# Patient Record
Sex: Male | Born: 1950 | Race: Black or African American | Hispanic: No | Marital: Married | Smoking: Never smoker
Health system: Southern US, Community
[De-identification: ages and names within clinical notes are randomized; demographics above are authoritative.]

## PROBLEM LIST (undated history)

## (undated) DIAGNOSIS — E079 Disorder of thyroid, unspecified: Secondary | ICD-10-CM

## (undated) DIAGNOSIS — E119 Type 2 diabetes mellitus without complications: Secondary | ICD-10-CM

## (undated) DIAGNOSIS — I10 Essential (primary) hypertension: Secondary | ICD-10-CM

## (undated) HISTORY — PX: REPLACEMENT TOTAL KNEE: SUR1224

---

## 2005-04-20 ENCOUNTER — Inpatient Hospital Stay: Payer: Self-pay | Admitting: Endocrinology

## 2005-04-20 ENCOUNTER — Other Ambulatory Visit: Payer: Self-pay

## 2006-06-30 ENCOUNTER — Ambulatory Visit: Payer: Self-pay | Admitting: Specialist

## 2011-07-28 ENCOUNTER — Ambulatory Visit: Payer: Self-pay | Admitting: Specialist

## 2011-07-28 LAB — BASIC METABOLIC PANEL
BUN: 17 mg/dL (ref 7–18)
Co2: 28 mmol/L (ref 21–32)
Creatinine: 1.27 mg/dL (ref 0.60–1.30)
EGFR (African American): >60
Osmolality: 275 (ref 275–301)
Potassium: 3.8 mmol/L (ref 3.5–5.1)
Sodium: 137 mmol/L (ref 136–145)

## 2011-07-28 LAB — URINALYSIS, COMPLETE
Bacteria: NONE SEEN
Bilirubin,UR: NEGATIVE
Blood: NEGATIVE
Leukocyte Esterase: NEGATIVE
Nitrite: NEGATIVE
Ph: 7 (ref 4.5–8.0)
RBC,UR: 4 /HPF (ref 0–5)
Squamous Epithelial: NONE SEEN
WBC UR: <1 /HPF (ref 0–5)

## 2011-07-28 LAB — PROTIME-INR: Prothrombin Time: 12.9 secs (ref 11.5–14.7)

## 2011-07-28 LAB — CBC
MCH: 30.5 pg (ref 26.0–34.0)
MCHC: 33.4 g/dL (ref 32.0–36.0)
MCV: 91 fL (ref 80–100)
Platelet: 274 10*3/uL (ref 150–440)
RDW: 13.7 % (ref 11.5–14.5)

## 2011-08-12 ENCOUNTER — Inpatient Hospital Stay: Payer: Self-pay | Admitting: Specialist

## 2011-08-13 LAB — BASIC METABOLIC PANEL
Anion Gap: 7 (ref 7–16)
Chloride: 104 mmol/L (ref 98–107)
Co2: 28 mmol/L (ref 21–32)
Creatinine: 1.22 mg/dL (ref 0.60–1.30)
Glucose: 104 mg/dL — ABNORMAL HIGH (ref 65–99)
Osmolality: 279 (ref 275–301)
Potassium: 3.6 mmol/L (ref 3.5–5.1)
Sodium: 139 mmol/L (ref 136–145)

## 2011-08-13 LAB — CBC WITH DIFFERENTIAL/PLATELET
Basophil #: 0 10*3/uL (ref 0.0–0.1)
Eosinophil #: 0.1 10*3/uL (ref 0.0–0.7)
Eosinophil %: 1.1 %
HGB: 10.7 g/dL — ABNORMAL LOW (ref 13.0–18.0)
Lymphocyte #: 1.5 10*3/uL (ref 1.0–3.6)
MCH: 29.8 pg (ref 26.0–34.0)
MCHC: 32.3 g/dL (ref 32.0–36.0)
MCV: 92 fL (ref 80–100)
Monocyte #: 1.5 x10 3/mm — ABNORMAL HIGH (ref 0.2–1.0)
Neutrophil #: 8.9 10*3/uL — ABNORMAL HIGH (ref 1.4–6.5)
Neutrophil %: 74.3 %
RBC: 3.61 10*6/uL — ABNORMAL LOW (ref 4.40–5.90)
RDW: 13.5 % (ref 11.5–14.5)

## 2011-08-14 LAB — HEMOGLOBIN: HGB: 10.2 g/dL — ABNORMAL LOW (ref 13.0–18.0)

## 2013-01-03 ENCOUNTER — Ambulatory Visit: Payer: Self-pay | Admitting: Specialist

## 2013-01-03 LAB — URINALYSIS, COMPLETE
Bilirubin,UR: NEGATIVE
Glucose,UR: NEGATIVE mg/dL (ref 0–75)
Nitrite: NEGATIVE

## 2013-01-03 LAB — APTT: Activated PTT: 32.7 secs (ref 23.6–35.9)

## 2013-01-03 LAB — BASIC METABOLIC PANEL
Co2: 28 mmol/L (ref 21–32)
Creatinine: 1.6 mg/dL — ABNORMAL HIGH (ref 0.60–1.30)
EGFR (Non-African Amer.): 46 — ABNORMAL LOW
Glucose: 110 mg/dL — ABNORMAL HIGH (ref 65–99)

## 2013-01-03 LAB — PROTIME-INR
INR: 0.9
Prothrombin Time: 12.7 secs (ref 11.5–14.7)

## 2013-01-03 LAB — MRSA PCR SCREENING

## 2013-01-03 LAB — HEMOGLOBIN: HGB: 13.1 g/dL (ref 13.0–18.0)

## 2013-01-10 ENCOUNTER — Inpatient Hospital Stay: Payer: Self-pay | Admitting: Specialist

## 2013-01-11 LAB — BASIC METABOLIC PANEL
Anion Gap: 5 — ABNORMAL LOW (ref 7–16)
BUN: 15 mg/dL (ref 7–18)
Calcium, Total: 8.5 mg/dL (ref 8.5–10.1)
EGFR (African American): >60
EGFR (Non-African Amer.): >60
Glucose: 126 mg/dL — ABNORMAL HIGH (ref 65–99)
Osmolality: 278 (ref 275–301)
Sodium: 138 mmol/L (ref 136–145)

## 2013-01-11 LAB — CBC WITH DIFFERENTIAL/PLATELET
Basophil %: 0.4 %
Eosinophil #: 0.2 10*3/uL (ref 0.0–0.7)
Eosinophil %: 1.4 %
Lymphocyte #: 1.5 10*3/uL (ref 1.0–3.6)
Lymphocyte %: 13.6 %
MCHC: 34.2 g/dL (ref 32.0–36.0)
Monocyte #: 1.4 x10 3/mm — ABNORMAL HIGH (ref 0.2–1.0)
Neutrophil #: 7.8 10*3/uL — ABNORMAL HIGH (ref 1.4–6.5)
Neutrophil %: 72 %
Platelet: 226 10*3/uL (ref 150–440)
RBC: 3.77 10*6/uL — ABNORMAL LOW (ref 4.40–5.90)

## 2013-01-12 LAB — HEMOGLOBIN: HGB: 11.2 g/dL — ABNORMAL LOW (ref 13.0–18.0)

## 2013-01-14 IMAGING — CR DG KNEE 1-2V*L*
1 series · 2 of 2 positions shown · non-contrast
Comparison: none

REASON FOR EXAM: post op
COMMENTS:   Bedside (portable):Y

[Series 1: ap · 0.17mm/px · 2 of 2 slices shown]
[im 1/2]
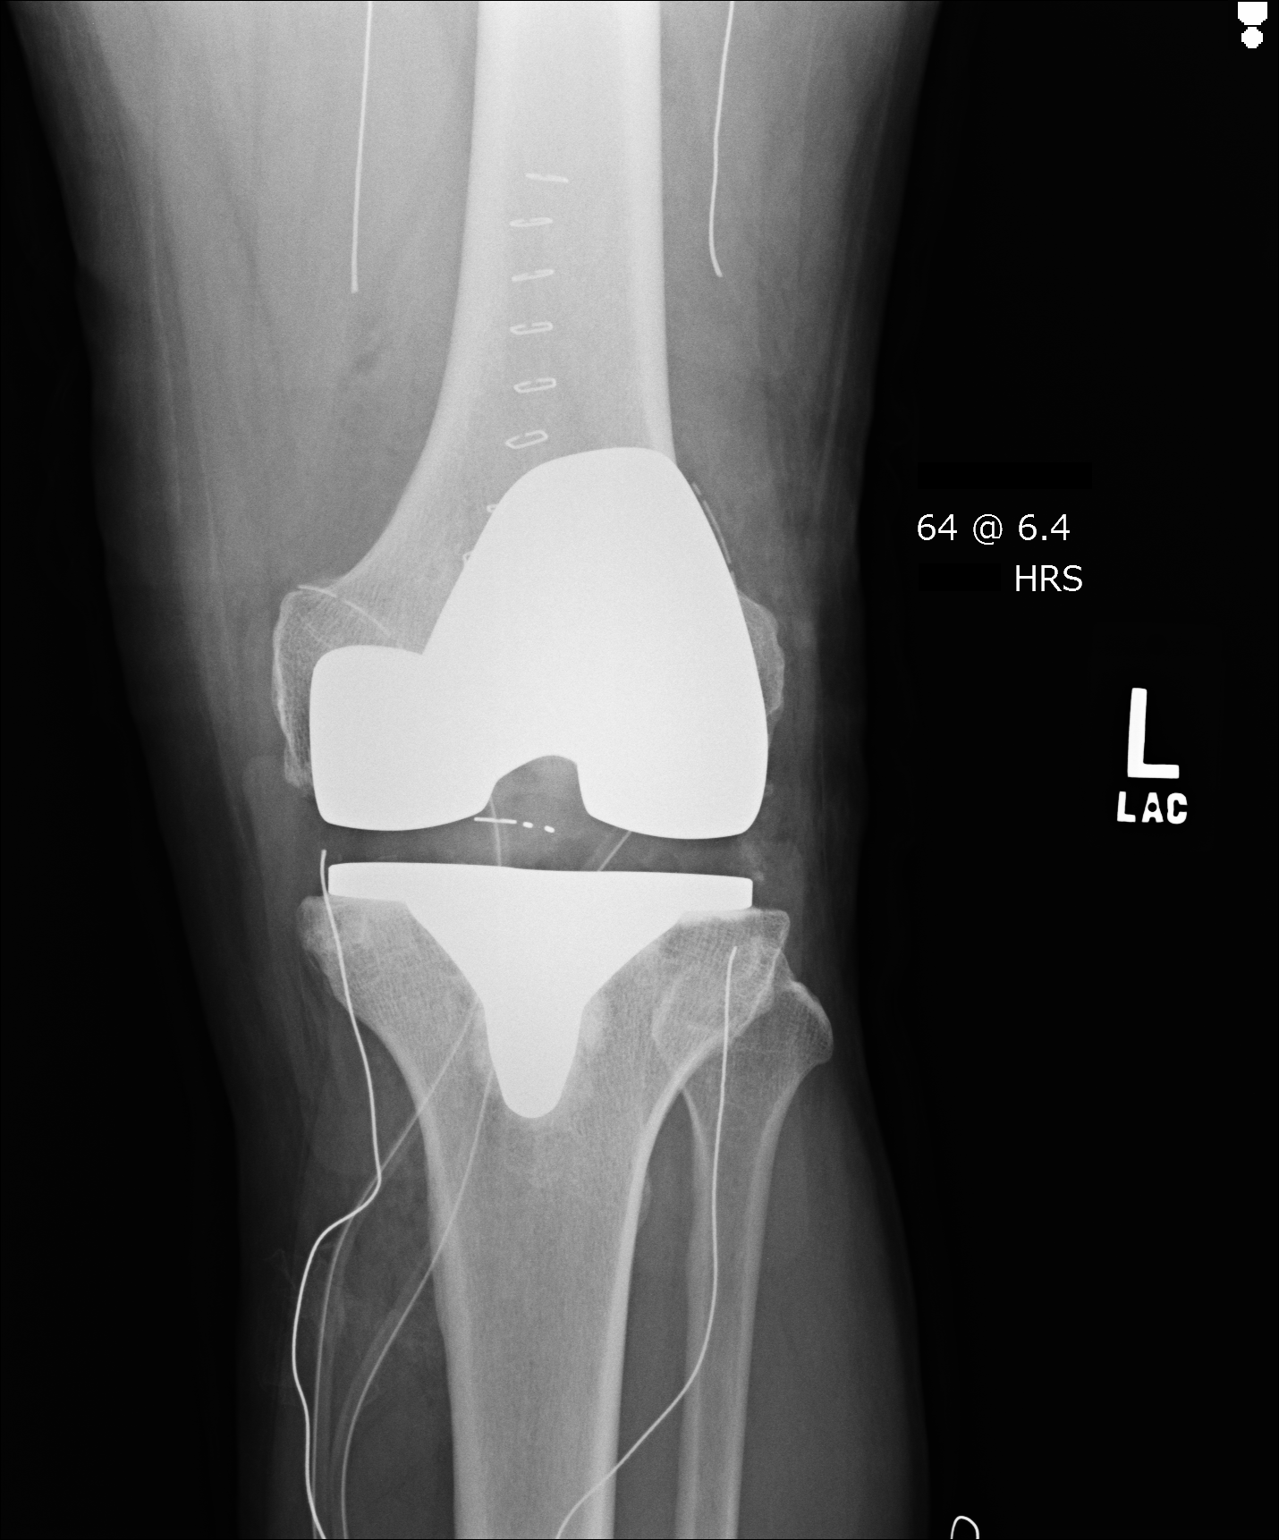
[im 2/2]
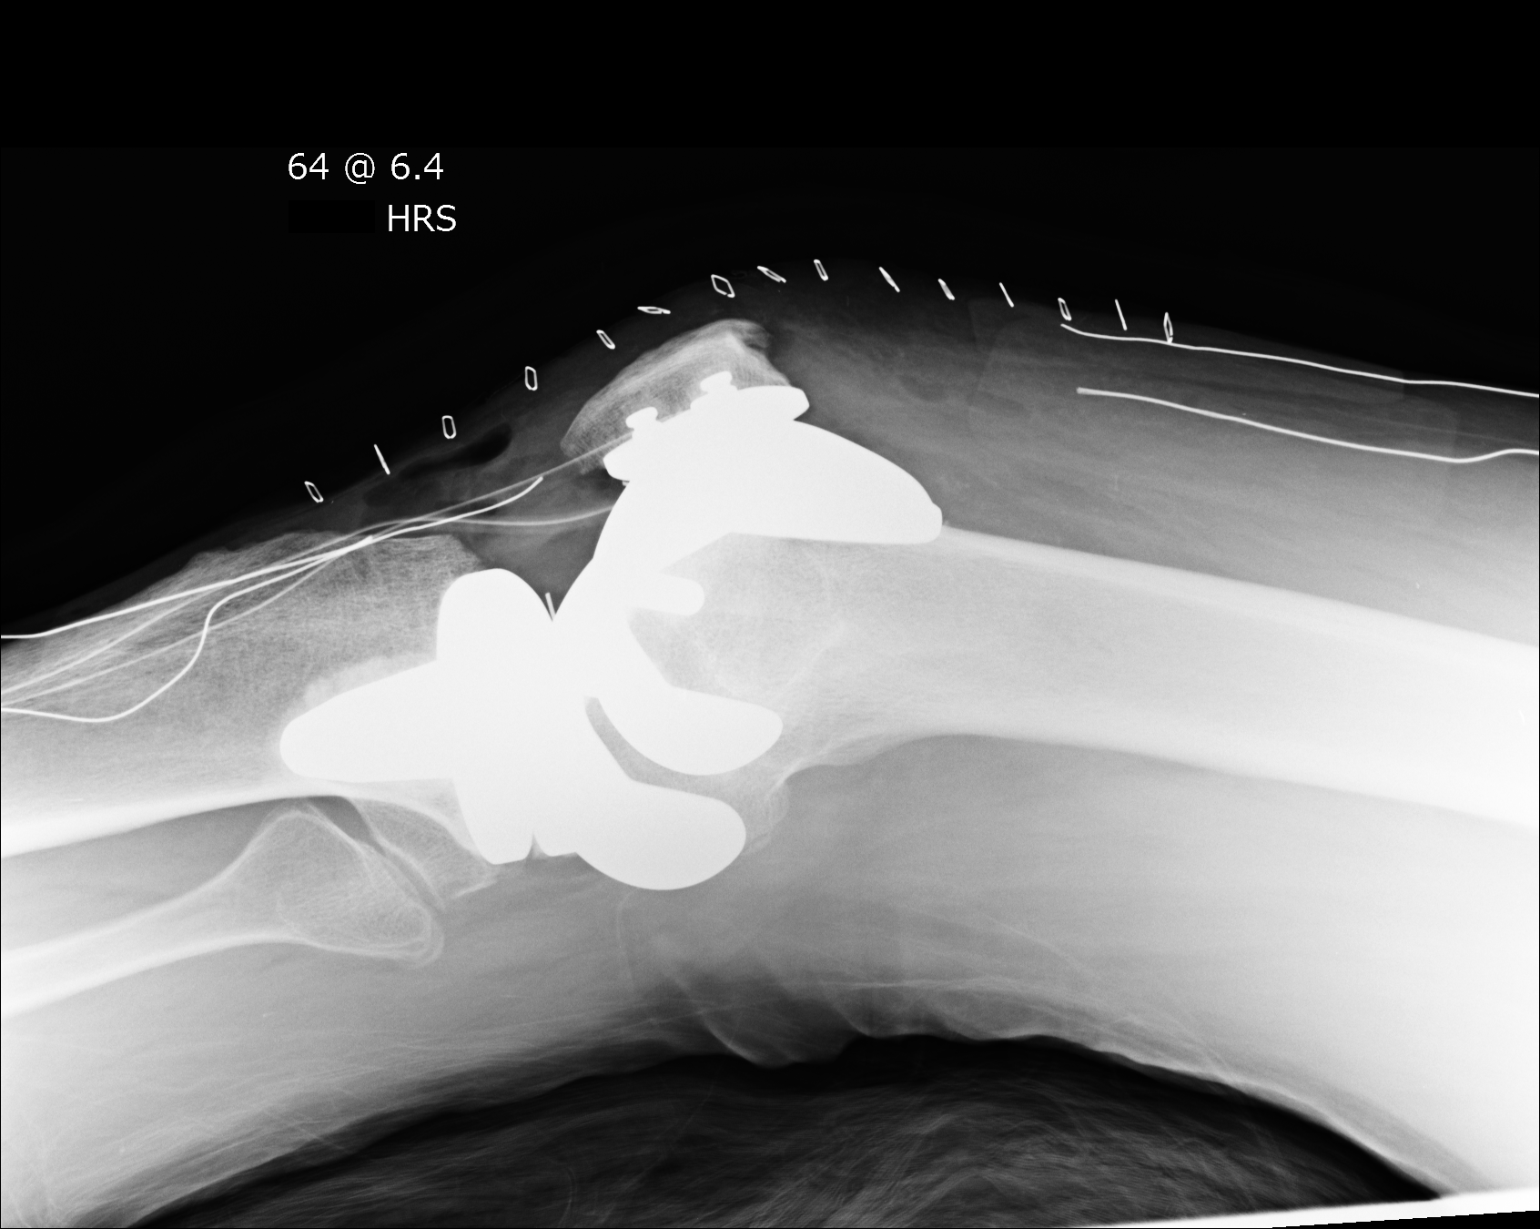

[2 of 2 positions shown; findings below may reference images not displayed]

PROCEDURE:     DXR - DXR KNEE LEFT AP AND LATERAL  - August 12, 2011  [DATE]

RESULT:     The patient is status post left knee arthroplasty. Surgical
drains and skin staples are present. A true lateral crosstable lateral view
was not obtained. There is no definite postoperative acute bony or harbor
complication.
IMPRESSION: Please see above.

[REDACTED]

## 2013-05-25 ENCOUNTER — Ambulatory Visit: Payer: Self-pay | Admitting: Gastroenterology

## 2013-05-28 LAB — PATHOLOGY REPORT

## 2014-06-22 NOTE — Discharge Summary (Signed)
PATIENT NAME:  Frederick Lopez, Frederick Lopez MR#:  [id number] DATE OF BIRTH:  August 13, 1950  DATE OF ADMISSION:  01/10/2013 DATE OF DISCHARGE:  01/13/2013  FINAL DIAGNOSES:   1.  Advanced osteoarthritis, right knee.  2.  Hypothyroidism.  3.  Hypertension.  4.  Diabetes mellitus.   OPERATIONS: 01/10/2013: Cemented DePuy rotating platform LCS total knee replacement.   COMPLICATIONS: None.   CONSULTATIONS: None.   DISCHARGE MEDICATIONS: Home medications as prior to admission, Norco 7.5/325 p.r.n. q.6 h. p.r.n. pain, Mobic 15 mg daily, Neurontin 400 mg b.i.Lopez., enteric-coated aspirin 1 p.o. b.i.Lopez.    HISTORY OF PRESENT ILLNESS: The patient is a 64 year old male with advanced osteoarthritis of the right knee causing significant pain and disability with normal activities and sleep. He has had treatment for several years including NSAIDs, injections, braces, and exercise. He had a successful left total knee replacement last year and wishes to proceed on the right. The risks and benefits were discussed with them. X-rays showed advanced osteoarthritis of the right knee with joint collapse, subluxation, sclerosis, and cysts.   PAST MEDICAL HISTORY:   ILLNESSES: As above.   ALLERGIES: None.   HOME MEDICATIONS: Byetta 0.04 mL b.i.Lopez., Janumet 50 mg b.i.Lopez., Synthroid 100 mcg daily, meloxicam 15 mg daily, lisinopril 20 mg daily, Viagra as needed, aspirin 81 mg daily.  PAST SURGICAL HISTORY: Appendectomy and left total knee replacement.   FAMILY HISTORY: Unremarkable.   SOCIAL HISTORY: The patient does not smoke or drink. He lives at home with his wife.  REVIEW OF SYSTEMS: Unremarkable.   PHYSICAL EXAMINATION: Normal except for the knees. The left knee showed a well-healed incision with motion from 0 to 110 degrees. The right knee showed increased varus with medial joint line tenderness. Motion was 10 to 95 degrees. Neurovascular status was good distally, and the skin was intact.   LABORATORY DATA: On admission  was satisfactory.   HOSPITAL COURSE: On 01/10/2013, the patient underwent right cemented LCS rotating platform total knee replacement. Postoperatively, he did well with no significant problems. Hemoglobin was 11.2 on the second postoperative day. He was discharged home on 01/13/2013. He will get home physical therapy and be partial weight-bearing on a walker. He will return to clinic in 2 weeks for exam.   ____________________________ Valinda HoarHoward E. Waniya Hoglund, MD hem:jcm Lopez: 02/01/2013 16:43:00 ET T: 02/01/2013 22:03:52 ET JOB#: [id number]  cc: Valinda HoarHoward E. Shelvia Fojtik, MD, <Dictator> Valinda HoarHOWARD E Vernona Peake MD ELECTRONICALLY SIGNED 02/02/2013 7:42

## 2014-06-22 NOTE — H&P (Signed)
   Subjective/Chief Complaint Right knee pain   History of Present Illness 64 year old male has advanced osteoarthritis right knee that causes significant pain and disability with normal daily activities and sleep.  Has had treatment for several years including NSAIDs, injections, braces, and exercise.  Had a successfur left total knee replacement last year and wishes to proceed with right knee now.  Risks and benefits of surgery were discussed at length including but not limited to infection, non union, nerve or blood vessed damage, non union, need for repeat surgery, blood clots and lung emboli, and death. X-rays show advanced osteoarthritis right knee with joint collapse, subluxation, sclerosis, spurs, and cysts.   Past Med/Surgical Hx:  Arthritis:   Hypothyroidism:   Hypertension:   Diabetes Mellitus, Type II (NIDD):   Appendectomy:   ALLERGIES:  No Known Allergies:   HOME MEDICATIONS: Medication Instructions Status  Byetta Prefilled Pen 10 mcg/0.04 mL subcutaneous solution milliliter(s) subcutaneous 2 times a day Active  Janumet 50 mg-1000 mg oral tablet 1 tab(s) orally 2 times a day Active  Synthroid 100 mcg (0.1 mg) oral tablet 1 tab(s) orally once a day Active  meloxicam 15 mg oral tablet 1 tab(s) orally once a day Active  lisinopril 20 mg oral tablet 1 tab(s) orally once a day (in the morning) Active  Viagra 100 mg oral tablet 1 tab(s) orally once a day, As Needed Active  aspirin 81 mg oral tablet 1 tab(s) orally once a day Active   Family and Social History:  Family History Non-Contributory   Social History negative tobacco   Place of Living Home   Review of Systems:  Fever/Chills No   Cough No   Sputum No   Abdominal Pain No   Physical Exam:  GEN well developed, well nourished, no acute distress   HEENT pink conjunctivae   NECK supple   RESP normal resp effort   CARD regular rate   ABD denies tenderness   LYMPH negative neck   EXTR negative edema,  Right knee has range of motion 10-95*.  Joint lines are tender. circulation/sensation/motor function good distally.  skin intact.   SKIN normal to palpation   NEURO motor/sensory function intact   PSYCH alert, A+O to time, place, person    Assessment/Admission Diagnosis Advanced osteoarthritis right knee   Plan Right total knee replacement   Electronic Signatures: Valinda HoarMiller, Salisha Bardsley E (MD)  (Signed [phone]-Nov-14 18:50)  Authored: CHIEF COMPLAINT and HISTORY, PAST MEDICAL/SURGIAL HISTORY, ALLERGIES, HOME MEDICATIONS, FAMILY AND SOCIAL HISTORY, REVIEW OF SYSTEMS, PHYSICAL EXAM, ASSESSMENT AND PLAN   Last Updated: 10-Nov-14 18:50 by Valinda HoarMiller, Elizabth Palka E (MD)

## 2014-06-22 NOTE — Op Note (Signed)
PATIENT NAME:  Frederick Lopez, [name] MR#:  [id number] DATE OF BIRTH:  04-20-1950  DATE OF PROCEDURE:  01/10/2013  PREOPERATIVE DIAGNOSIS: Advanced osteoarthritis, right knee.   POSTOPERATIVE DIAGNOSIS: Advanced osteoarthritis, right knee.   PROCEDURE PERFORMED: DePuy cemented LCS rotating platform total knee replacement (large femur/patella, #5 keeled tibia, 12.5 mm rotating polyethylene insert).   SURGEON: Valinda HoarHoward E. Bhavana Kady, M.D.   ASSISTANT: Juanell FairlyKevin Krasinski, MD   ANESTHESIA: Spinal plus femoral nerve block.   COMPLICATIONS: None.   DRAINS: Two Autovacs.   ESTIMATED BLOOD LOSS: None.  REPLACEMENTS: None.   DESCRIPTION OF PROCEDURE: The patient was brought to the operating room where he underwent satisfactory spinal anesthesia after a right femoral nerve block. He was placed in supine position and padded appropriately on the operating room table. The right leg was prepped and draped in sterile fashion and an Esmarch was applied. The tourniquet was inflated to 350 mmHg. Tourniquet time was 114 minutes. A midline anterior incision was made and dissection carried out sharply through subcutaneous tissue. Medial arthrotomy was carried out and soft tissue debridement was carried out. The knee showed shiny sclerotic bare bone on the medial femoral condyle and lateral femoral condyle. The tibia was damaged and there were large spurs throughout. The tibial alignment jig was inserted and the cutting block pinned in place appropriately. The proximal tibial cut was then made. The ligaments were checked and were well balanced. The femur was sized as a large and the centering hole made. The rotation guide was inserted along with a 2.5 mm shim and the flexion gap was established at 12.5 mm. This was quite stable. Alignment was excellent. The anterior cutting guide was pinned in place and the anterior and posterior cuts were made. The distal 4 degree valgus distal femoral cutting guide was inserted and the distal  femoral cut made. Finishing guide was then applied and the finishing cuts made. The tibia was then sized as a #5 and the centering hole made. The trial was put in place and a 12.5 mm trial poly was inserted. The large femoral component was inserted and the knee articulated nicely. There was excellent stability in flexion and extension with full good extension. The patella was then cut and drilled. A trial was inserted and it tracked well. The trials were then all removed and the knee was thoroughly irrigated and dried with final soft tissue and debridement carried out while cement was mixed. The #5 keeled tibial component was then cemented in place along with a large femur and patella. The 12.5 mm rotating platform polyethylene insert was put in place and the knee articulated. All excess cement was removed and the cement was allowed to harden around all components. The range of motion remained excellent. The wound was again irrigated. Soft tissues were infiltrated with 60 mL of 0.25% Marcaine with epinephrine, Toradol and morphine. Autovac drains were inserted. The capsule was closed with #2 quill and the subcutaneous tissue was closed with 0 quill. The skin was closed with staples. A dry sterile dressing was applied over TENS pads. The Autovac was activated. The tourniquet was deflated with good return of blood flow to the foot. The Polar Care and knee immobilizer were applied. The patient was transferred to her hospital bed and taken to recovery in good condition.  ____________________________ Valinda HoarHoward E. Ryann Pauli, MD hem:sb D: 01/10/2013 10:39:57 ET T: 01/10/2013 10:51:03 ET JOB#: [id number]  cc: Valinda HoarHoward E. Tarrance Januszewski, MD, <Dictator> Valinda HoarHOWARD E Ruben Pyka MD ELECTRONICALLY SIGNED 01/10/2013 17:56

## 2014-06-24 NOTE — Discharge Summary (Signed)
PATIENT NAME:  Frederick Lopez, Frederick Lopez MR#:  [id number] DATE OF BIRTH:  1950/06/04  DATE OF ADMISSION:  08/12/2011 DATE OF DISCHARGE:  08/15/2011  DISCHARGE DIAGNOSES:   1. Advanced osteoarthritis of both knees, left greater than right. 2. Hypothyroidism.  3. Hypertension.   OPERATION: On 08/12/2011, a DePuy cemented rotating platform LCS total knee replacement. Complications were none.   CONSULTATIONS: None.   DISCHARGE MEDICATIONS:  1. Enteric-coated aspirin1 p.o. b.i.Lopez. for 6 weeks.  2. Norco 5/325, 1 to 2 every 6 hours p.r.n. pain. 3. Neurontin 400 mg b.i.Lopez.  4. Mobic 15 mg daily.  5. Byetta injection 5 mcg subcutaneous every day. 6. Synthroid 0.1 mg in the a.m.  7. Zestoretic 20/12.5 mg, 1 daily.  8. Metformin 1000 mg b.i.Lopez.  9. Januvia 50 mg b.i.Lopez.   HISTORY: The patient is a 64 year old male with advanced osteoarthritis of both knees, left greater than right. He has been followed for several years and has had anti-inflammatories, cortisone injections, exercise program, activity modification, and rest. He continues to work and is on his feet all day. He has reached the point that he has constant knee pain and has had trouble doing his work and with sleep. X-rays show complete medial joint line narrowing and loss of joint space and spurring in all three compartments in both knees, cyst formation and sclerosis, some instability on the left. He has difficulty with stairs, yard work, his regular job and kneeling and squatting. He wishes to proceed with left total knee replacement. The risks, benefits and postoperative protocol were discussed with him at length.    PAST MEDICAL HISTORY/ILLNESSES: As above.   ALLERGIES: None.   REVIEW OF SYSTEMS: Unremarkable.   FAMILY HISTORY: Unremarkable.   SOCIAL HISTORY: The patient is widowed. 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 He does not smoke but did in the past for several years.   PHYSICAL EXAMINATION:  VITAL SIGNS: The vital signs are  normal.   GENERAL: The patient was awake and alert and cooperative.   LEFT LEG: The left leg showed varus deformity with medial joint line tenderness. Motion was 10 to 90 degrees. His neurovascular status was good distally. There was no real effusion. There was pain with movement.   LABORATORY DATA: Laboratory data on admission was satisfactory.   HOSPITAL COURSE: On 08/12/2011, the patient underwent cemented rotating platform left total knee replacement. Postoperatively he did well with no significant pain or problems. His hemoglobin was 10.2 on the second postoperative day. He was gradually ambulated and did well with physical therapy. He was stable and ready for discharge on 08/15/2011.   DISCHARGE INSTRUCTIONS:  1. He was discharged with Home Health physical therapy, partial weight-bearing on a walker.  2. He is to be seen in my office in 12 to 14 days for exam and x-ray.  ____________________________ Valinda HoarHoward E. Dortha Neighbors, MD hem:cbb Lopez: 08/25/2011 08:45:52 ET T: 08/25/2011 10:28:21 ET JOB#: [id number]  cc: Valinda HoarHoward E. Keyira Mondesir, MD, <Dictator> Alan MulderShamil J. Morayati, MD Valinda HoarHOWARD E Javyon Fontan MD ELECTRONICALLY SIGNED 08/27/2011 10:31

## 2014-06-24 NOTE — H&P (Signed)
PATIENT NAME:  Frederick Lopez, Frederick Lopez MR#:  [id number] DATE OF BIRTH:  1950-08-20  DATE OF ADMISSION:  08/12/2011  CHIEF COMPLAINT: Pain in left knee.   HISTORY OF PRESENT ILLNESS: The patient is a 64 year old male who has had progressive arthritis of both knees, left greater than right, over the last several years. I have been following him since 2008. He has had multiple injections over the years along with anti-inflammatory medications, exercise programs, and activity modifications. He continues to work on his feet. He has reached the point that x-rays show advanced osteoarthritis of the knees. He has complete medial joint line narrowing and loss of joint space and spurring in all three compartments. There is cyst formation and sclerosis. The patient wishes to proceed with left total knee replacement. The risks and benefits have been discussed with the patient and his wife along with the postoperative protocol and possible complications.   PAST MEDICAL HISTORY:  1. Hypothyroidism.  2. Hypertension.   MEDICATIONS:  Meloxicam, Synthroid, lisinopril, Viagra and InnoPran.   PAST SURGICAL HISTORY: None.   HOSPITALIZATIONS: Hospitalizations for diabetes, high blood pressure and thyroid disease.   REVIEW OF SYSTEMS: The review of systems is unremarkable.   FAMILY HISTORY: Unremarkable.   SOCIAL HISTORY: The patient does not smoke but did in the past for several years. 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   PHYSICAL EXAMINATION:  GENERAL: The patient is alert and cooperative.   HEENT: Pupils are equal, round, and reactive to light. Extraocular motions are full.  NECK: Supple without jugular venous distention or adenopathy.   LUNGS: Clear.   HEART: Regular rate without murmur, rub, or gallop.   ABDOMEN: Soft and nontender.   BACK: Nontender.   GENITALIA: Normal.   RECTAL: Exam is refused.   ORTHOPEDIC EXAM:  Exam shows bilateral varus of the knees. There is medial joint  line tenderness bilaterally. Motion is 5 to 90 degrees bilaterally. Hips with good motion. Neurovascular status is good distally without peripheral edema.   SKIN: Intact.   IMPRESSION: Advanced osteoarthritis of both knees, left greater than right.   RECOMMENDATION: The patient is being admitted for left total knee replacement.   ____________________________ Valinda HoarHoward E. Lisaanne Lawrie, MD hem:cbb Lopez: 08/11/2011 18:37:00 ET T: 08/11/2011 19:00:00 ET JOB#: [id number]  cc: Valinda HoarHoward E. Egan Sahlin, MD, <Dictator> Valinda HoarHOWARD E Arian Mcquitty MD ELECTRONICALLY SIGNED 08/11/2011 19:13

## 2014-06-24 NOTE — Op Note (Signed)
PATIENT NAME:  Frederick Lopez, Umberto D MR#:  [id number] DATE OF BIRTH:  20-Jun-1950  DATE OF PROCEDURE:  08/12/2011  PREOPERATIVE DIAGNOSIS: Severe osteoarthritis of left knee with varus deformity.   POSTOPERATIVE DIAGNOSIS: Severe osteoarthritis of left knee with varus deformity.   PROCEDURE: Left DePuy cemented rotating platform LCS total knee replacement (large femur/patella, #4 tibial tray, 10 mm rotating platform polyethylene spacer).   SURGEON: Valinda HoarHoward E. Hula Tasso, M.D.   ASSISTANT: Juanell FairlyKevin Krasinski, M.D.   ANESTHESIA: Spinal plus femoral nerve block.   COMPLICATIONS: None.   DRAINS: Two Autovacs.   ESTIMATED BLOOD LOSS: None.   REPLACEMENTS: None.   DESCRIPTION OF PROCEDURE: The patient was brought to the Operating Room where he underwent satisfactory spinal anesthesia, after a left femoral nerve block. The left leg was prepped and draped in sterile fashion. An Esmarch was applied. The tourniquet was inflated to 350 mmHg. Tourniquet time was 112 minutes. An anterior midline incision was made and dissection carried out sharply through subcutaneous tissue. Medial arthrotomy was carried out and soft tissue debridement carried out. Tibial alignment jig was applied and the tibial cutting jig pinned in place. The proximal tibial cut was made and was felt to be in very good position. The ligaments had been released medially to balance them. The femur was sized as a large and the centering hole made in the femur. The anterior cutting block was pinned in place with the 10 mm flexion gap established and quite stable. The anterior posterior cuts were made and the distal femoral cutting guide inserted at 4 degrees of valgus. The distal femoral cuts were made and the finishing block applied and the cuts made. The tibia was sized as #4 and a centering hole made. The trial tibial insert and the femoral component were inserted and the knee articulated nicely. He had full extension and flexion and good alignment.  The patella was cut and drilled and trial inserted. This tracked well. The trials were removed and the joint thoroughly irrigated, debrided, and cleaned up while cement was mixed. The #4 rotating platform keeled tibial tray was cemented in place along with the large femur and patella. A 10 mm rotating platform polyethylene insert was then inserted as well. All excess cement was removed and the cement allowed to harden. Thorough irrigation was again carried out and all excess cement was removed. The soft tissues were infiltrated with 60 mL of 0.25% Marcaine with epinephrine, Toradol, and morphine. The Autovacs were inserted. The fascia was closed with #2 quill suture and the subcutaneous tissue was closed with 2-0 quill. The skin was closed with staples. A dry sterile dressing was applied over Xeroform and TENS pads. The Autovac was hooked up and activated. The dressing was completed and Polar Care and knee immobilizer applied. There was good return of blood flow to the foot when the tourniquet was deflated. The patient was transferred to a stretcher and taken to recovery in good condition. ____________________________ Valinda HoarHoward E. Zlatan Hornback, MD hem:slb D: 08/12/2011 16:02:19 ET T: 08/12/2011 17:06:00 ET JOB#: [id number]  cc: Valinda HoarHoward E. Meghna Hagmann, MD, <Dictator> Valinda HoarHOWARD E Teodora Baumgarten MD ELECTRONICALLY SIGNED 08/14/2011 11:40

## 2019-08-18 ENCOUNTER — Other Ambulatory Visit: Payer: Self-pay

## 2019-08-18 ENCOUNTER — Ambulatory Visit
Admission: EM | Admit: 2019-08-18 | Discharge: 2019-08-18 | Disposition: A | Discharge status: Home / Self Care | Payer: Medicare HMO

## 2019-08-18 ENCOUNTER — Ambulatory Visit (INDEPENDENT_AMBULATORY_CARE_PROVIDER_SITE_OTHER): Payer: Medicare HMO

## 2019-08-18 DIAGNOSIS — S46912A Strain of unspecified muscle, fascia and tendon at shoulder and upper arm level, left arm, initial encounter: Secondary | ICD-10-CM

## 2019-08-18 HISTORY — DX: Type 2 diabetes mellitus without complications: E11.9

## 2019-08-18 HISTORY — DX: Essential (primary) hypertension: I10

## 2019-08-18 HISTORY — DX: Disorder of thyroid, unspecified: E07.9
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
## 2019-08-18 NOTE — Discharge Instructions (Addendum)
Return if any problems.  See your Physician for recheck if symptoms persist past one week.  Ibuprofen for discomfort
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
## 2020-03-29 DIAGNOSIS — Z96653 Presence of artificial knee joint, bilateral: Secondary | ICD-10-CM | POA: Insufficient documentation

## 2022-02-02 LAB — COLOGUARD: COLOGUARD: POSITIVE — AB

## 2022-12-04 DIAGNOSIS — Z23 Encounter for immunization: Secondary | ICD-10-CM | POA: Diagnosis not present

## 2022-12-21 DIAGNOSIS — K219 Gastro-esophageal reflux disease without esophagitis: Secondary | ICD-10-CM | POA: Diagnosis not present

## 2022-12-21 DIAGNOSIS — R269 Unspecified abnormalities of gait and mobility: Secondary | ICD-10-CM | POA: Insufficient documentation

## 2022-12-21 DIAGNOSIS — E119 Type 2 diabetes mellitus without complications: Secondary | ICD-10-CM | POA: Insufficient documentation

## 2022-12-21 DIAGNOSIS — M6530 Trigger finger, unspecified finger: Secondary | ICD-10-CM | POA: Insufficient documentation

## 2022-12-21 DIAGNOSIS — Z125 Encounter for screening for malignant neoplasm of prostate: Secondary | ICD-10-CM | POA: Diagnosis not present

## 2022-12-21 DIAGNOSIS — E039 Hypothyroidism, unspecified: Secondary | ICD-10-CM | POA: Diagnosis not present

## 2022-12-21 DIAGNOSIS — M25669 Stiffness of unspecified knee, not elsewhere classified: Secondary | ICD-10-CM | POA: Insufficient documentation

## 2022-12-21 DIAGNOSIS — I10 Essential (primary) hypertension: Secondary | ICD-10-CM | POA: Insufficient documentation

## 2023-04-13 DIAGNOSIS — E119 Type 2 diabetes mellitus without complications: Secondary | ICD-10-CM | POA: Diagnosis not present

## 2023-04-13 DIAGNOSIS — Z0000 Encounter for general adult medical examination without abnormal findings: Secondary | ICD-10-CM | POA: Diagnosis not present

## 2023-04-13 DIAGNOSIS — I10 Essential (primary) hypertension: Secondary | ICD-10-CM | POA: Diagnosis not present

## 2023-04-13 DIAGNOSIS — E039 Hypothyroidism, unspecified: Secondary | ICD-10-CM | POA: Diagnosis not present

## 2023-04-13 DIAGNOSIS — Z125 Encounter for screening for malignant neoplasm of prostate: Secondary | ICD-10-CM | POA: Diagnosis not present

## 2023-04-13 DIAGNOSIS — K219 Gastro-esophageal reflux disease without esophagitis: Secondary | ICD-10-CM | POA: Diagnosis not present

## 2023-04-13 DIAGNOSIS — E669 Obesity, unspecified: Secondary | ICD-10-CM | POA: Diagnosis not present

## 2023-04-13 DIAGNOSIS — Z6838 Body mass index (BMI) 38.0-38.9, adult: Secondary | ICD-10-CM | POA: Diagnosis not present

## 2023-04-21 DIAGNOSIS — M7021 Olecranon bursitis, right elbow: Secondary | ICD-10-CM | POA: Diagnosis not present

## 2023-04-22 ENCOUNTER — Telehealth: Payer: Self-pay

## 2023-04-22 ENCOUNTER — Other Ambulatory Visit: Payer: Self-pay

## 2023-04-22 DIAGNOSIS — R195 Other fecal abnormalities: Secondary | ICD-10-CM

## 2023-04-22 DIAGNOSIS — Z1211 Encounter for screening for malignant neoplasm of colon: Secondary | ICD-10-CM

## 2023-04-22 MED ORDER — PEG 3350-KCL-NA BICARB-NACL 420 G PO SOLR: 4000.0000 mL | Freq: Once | ORAL | 0 refills | Status: AC

## 2023-04-22 NOTE — Telephone Encounter (Signed)
 Gastroenterology Pre-Procedure Review  Request Date: 06/25/23 Requesting Physician: Dr. Tobi Bastos  PATIENT REVIEW QUESTIONS: The patient responded to the following health history questions as indicated:    1. Are you having any GI issues? Positive cologuard. States when he eats spicy foods he gets stomach ulcers 2. Do you have a personal history of Polyps? no 3. Do you have a family history of Colon Cancer or Polyps? no 4. Diabetes Mellitus? Yes takes janumet has been advised to stop 2 days prior to colonoscopy 5. Joint replacements in the past 12 months?no 6. Major health problems in the past 3 months?no 7. Any artificial heart valves, MVP, or defibrillator?no    MEDICATIONS & ALLERGIES:    Patient reports the following regarding taking any anticoagulation/antiplatelet therapy:   Plavix, Coumadin, Eliquis, Xarelto, Lovenox, Pradaxa, Brilinta, or Effient? no Aspirin? no  Patient confirms/reports the following medications:  Current Outpatient Medications  Medication Sig Dispense Refill   atenolol (TENORMIN) 25 MG tablet 1/2 a tab     carvedilol (COREG) 6.25 MG tablet Take 6.25 mg by mouth 2 (two) times daily.     ferrous sulfate 325 (65 FE) MG tablet Take by mouth.     JANUMET 50-1000 MG tablet Take 1 tablet by mouth 2 (two) times daily.     lisinopril (ZESTRIL) 20 MG tablet Take 20 mg by mouth daily.     omeprazole (PRILOSEC) 20 MG capsule Take by mouth.     simvastatin (ZOCOR) 10 MG tablet Take 10 mg by mouth at bedtime.     SYNTHROID 125 MCG tablet Take 125 mcg by mouth daily.     No current facility-administered medications for this visit.    Patient confirms/reports the following allergies:  No Known Allergies  No orders of the defined types were placed in this encounter.   AUTHORIZATION INFORMATION Primary Insurance: 1D#: Group #:  Secondary Insurance: 1D#: Group #:  SCHEDULE INFORMATION: Date:  Time: Location:

## 2023-06-23 ENCOUNTER — Encounter: Payer: Self-pay | Admitting: Gastroenterology

## 2023-06-23 ENCOUNTER — Telehealth: Payer: Self-pay | Admitting: *Deleted

## 2023-06-23 MED ORDER — PEG 3350-KCL-NA BICARB-NACL 420 G PO SOLR: 4000.0000 mL | Freq: Once | ORAL | 0 refills | Status: AC

## 2023-06-23 NOTE — Telephone Encounter (Signed)
 Patient called office stating his prep solution was not available at [facility].  Inform patient that I will call and have his Rx filled from 04/22/2023.  Called [facility] and sent Rx as requested.

## 2023-06-24 ENCOUNTER — Encounter: Payer: Self-pay | Admitting: Gastroenterology

## 2023-06-25 ENCOUNTER — Ambulatory Visit
Admission: RE | Admit: 2023-06-25 | Discharge: 2023-06-25 | Disposition: A | Discharge status: Home / Self Care | Payer: Medicare HMO | Attending: Gastroenterology | Admitting: Gastroenterology

## 2023-06-25 ENCOUNTER — Ambulatory Visit: Payer: Self-pay | Admitting: Anesthesiology

## 2023-06-25 ENCOUNTER — Other Ambulatory Visit: Payer: Self-pay

## 2023-06-25 ENCOUNTER — Encounter: Payer: Self-pay | Admitting: Gastroenterology

## 2023-06-25 ENCOUNTER — Encounter
Admission: RE | Disposition: A | Discharge status: Home / Self Care | Payer: Self-pay | Source: Home / Self Care | Attending: Gastroenterology

## 2023-06-25 DIAGNOSIS — E119 Type 2 diabetes mellitus without complications: Secondary | ICD-10-CM | POA: Diagnosis not present

## 2023-06-25 DIAGNOSIS — Z6837 Body mass index (BMI) 37.0-37.9, adult: Secondary | ICD-10-CM | POA: Insufficient documentation

## 2023-06-25 DIAGNOSIS — K635 Polyp of colon: Secondary | ICD-10-CM

## 2023-06-25 DIAGNOSIS — K640 First degree hemorrhoids: Secondary | ICD-10-CM

## 2023-06-25 DIAGNOSIS — R195 Other fecal abnormalities: Secondary | ICD-10-CM | POA: Diagnosis not present

## 2023-06-25 DIAGNOSIS — E669 Obesity, unspecified: Secondary | ICD-10-CM | POA: Insufficient documentation

## 2023-06-25 DIAGNOSIS — I10 Essential (primary) hypertension: Secondary | ICD-10-CM | POA: Insufficient documentation

## 2023-06-25 DIAGNOSIS — Z87891 Personal history of nicotine dependence: Secondary | ICD-10-CM | POA: Insufficient documentation

## 2023-06-25 DIAGNOSIS — Z1211 Encounter for screening for malignant neoplasm of colon: Secondary | ICD-10-CM | POA: Diagnosis not present

## 2023-06-25 DIAGNOSIS — D126 Benign neoplasm of colon, unspecified: Secondary | ICD-10-CM

## 2023-06-25 HISTORY — PX: POLYPECTOMY: SHX149

## 2023-06-25 HISTORY — PX: COLONOSCOPY WITH PROPOFOL: SHX5780

## 2023-06-25 LAB — GLUCOSE, CAPILLARY: Glucose-Capillary: 114 mg/dL — ABNORMAL HIGH (ref 70–99)

## 2023-06-25 SURGERY — COLONOSCOPY WITH PROPOFOL
Anesthesia: General

## 2023-06-25 MED ORDER — PROPOFOL 500 MG/50ML IV EMUL
INTRAVENOUS | Status: DC | PRN
  Administered 2023-06-25: 50 mg via INTRAVENOUS
  Administered 2023-06-25: 100 ug/kg/min via INTRAVENOUS

## 2023-06-25 MED ORDER — PROPOFOL 10 MG/ML IV BOLUS
INTRAVENOUS | Status: AC
  Filled 2023-06-25: qty 40

## 2023-06-25 MED ORDER — GLYCOPYRROLATE 0.2 MG/ML IJ SOLN
INTRAMUSCULAR | Status: DC | PRN
  Administered 2023-06-25: .2 mg via INTRAVENOUS

## 2023-06-25 MED ORDER — LIDOCAINE HCL (PF) 2 % IJ SOLN
INTRAMUSCULAR | Status: AC
  Filled 2023-06-25: qty 5

## 2023-06-25 MED ORDER — LIDOCAINE HCL (CARDIAC) PF 100 MG/5ML IV SOSY
PREFILLED_SYRINGE | INTRAVENOUS | Status: DC | PRN
  Administered 2023-06-25: 50 mg via INTRAVENOUS

## 2023-06-25 MED ORDER — SODIUM CHLORIDE 0.9 % IV SOLN: INTRAVENOUS | Status: DC

## 2023-06-25 NOTE — Op Note (Signed)
 [facility] Gastroenterology Patient Name: Frederick Lopez Procedure Date: 06/25/2023 10:03 AM MRN: [id number] Account #: [id number] Date of Birth: [birth year] Admit Type: Outpatient Age: 73 Room: [facility] ENDO ROOM 4 Gender: Male Note Status: Finalized Instrument Name: Charlyn Cooley [id number] Procedure:             Colonoscopy Indications:           Screening for colorectal malignant neoplasm due to                         positive Cologuard test Providers:             Luke Salaam MD, MD Referring MD:          Lavelle Posey. Vila Grayer, MD (Referring MD) Medicines:             Monitored Anesthesia Care Complications:         No immediate complications. Procedure:             Pre-Anesthesia Assessment:                        - Prior to the procedure, a History and Physical was                         performed, and patient medications, allergies and                         sensitivities were reviewed. The patient's tolerance                         of previous anesthesia was reviewed.                        - The risks and benefits of the procedure and the                         sedation options and risks were discussed with the                         patient. All questions were answered and informed                         consent was obtained.                        - ASA Grade Assessment: II - A patient with mild                         systemic disease.                        After obtaining informed consent, the colonoscope was                         passed under direct vision. Throughout the procedure,                         the patient's blood pressure, pulse, and oxygen                         saturations were  monitored continuously. The                         Colonoscope was introduced through the anus and                         advanced to the the cecum, identified by the                         appendiceal orifice. The colonoscopy was performed                          with ease. The patient tolerated the procedure well.                         The quality of the bowel preparation was excellent.                         The ileocecal valve, appendiceal orifice, and rectum                         were photographed. Findings:      The perianal and digital rectal examinations were normal.      A 3 mm polyp was found in the transverse colon. The polyp was sessile.       The polyp was removed with a cold snare. Resection was complete, but the       polyp tissue was not retrieved.      Non-bleeding internal hemorrhoids were found during retroflexion. The       hemorrhoids were medium-sized and Grade I (internal hemorrhoids that do       not prolapse).      The exam was otherwise without abnormality on direct and retroflexion       views. Impression:            - One 3 mm polyp in the transverse colon, removed with                         a cold snare. Complete resection. Polyp tissue not                         retrieved.                        - Non-bleeding internal hemorrhoids.                        - The examination was otherwise normal on direct and                         retroflexion views. Recommendation:        - Discharge patient to home (with escort).                        - Resume previous diet.                        - Continue present medications.                        - Repeat colonoscopy  in 5 years for surveillance. Procedure Code(s):     --- Professional ---                        [phone], Colonoscopy, flexible; with removal of                         tumor(s), polyp(s), or other lesion(s) by snare                         technique Diagnosis Code(s):     --- Professional ---                        Z12.11, Encounter for screening for malignant neoplasm                         of colon                        R19.5, Other fecal abnormalities                        D12.3, Benign neoplasm of transverse colon (hepatic                          flexure or splenic flexure)                        K64.0, First degree hemorrhoids CPT copyright 2022 American Medical Association. All rights reserved. The codes documented in this report are preliminary and upon coder review may  be revised to meet current compliance requirements. Luke Salaam, MD Luke Salaam MD, MD 06/25/2023 10:27:20 AM This report has been signed electronically. Number of Addenda: 0 Note Initiated On: 06/25/2023 10:03 AM Scope Withdrawal Time: 0 hours 12 minutes 43 seconds  Total Procedure Duration: 0 hours 15 minutes 18 seconds  Estimated Blood Loss:  Estimated blood loss: none.      [facility]

## 2023-06-25 NOTE — Anesthesia Postprocedure Evaluation (Signed)
 Anesthesia Post Note  Patient: Frederick Lopez  Procedure(s) Performed: COLONOSCOPY WITH PROPOFOL  POLYPECTOMY, INTESTINE  Patient location during evaluation: PACU Anesthesia Type: General Level of consciousness: awake and alert, oriented and patient cooperative Pain management: pain level controlled Vital Signs Assessment: post-procedure vital signs reviewed and stable Respiratory status: spontaneous breathing, nonlabored ventilation and respiratory function stable Cardiovascular status: blood pressure returned to baseline and stable Postop Assessment: adequate PO intake Anesthetic complications: no   No notable events documented.   Last Vitals:  Vitals:   06/25/23 1039 06/25/23 1050  BP: 105/73 124/81  Pulse: 76   Resp: 20 20  Temp: (!) 35.7 C   SpO2: 100% 100%    Last Pain:  Vitals:   06/25/23 1050  TempSrc:   PainSc: 0-No pain                 Dorothey Gate

## 2023-06-25 NOTE — Anesthesia Preprocedure Evaluation (Signed)
 Anesthesia Evaluation  Patient identified by MRN, date of birth, ID band Patient awake    Reviewed: Allergy & Precautions, NPO status , Patient's Chart, lab work & pertinent test results  History of Anesthesia Complications Negative for: history of anesthetic complications  Airway Mallampati: I   Neck ROM: Full    Dental   Bridges, crowns:   Pulmonary former smoker (quit greater than 20 years ago)   Pulmonary exam normal breath sounds clear to auscultation       Cardiovascular hypertension, Normal cardiovascular exam Rhythm:Regular Rate:Normal     Neuro/Psych negative neurological ROS     GI/Hepatic ,GERD  ,,  Endo/Other  diabetes, Type 2Hypothyroidism  Obesity   Renal/GU negative Renal ROS     Musculoskeletal   Abdominal   Peds  Hematology negative hematology ROS (+)   Anesthesia Other Findings   Reproductive/Obstetrics                             Anesthesia Physical Anesthesia Plan  ASA: 2  Anesthesia Plan: General   Post-op Pain Management:    Induction: Intravenous  PONV Risk Score and Plan: 2 and Propofol  infusion, TIVA and Treatment may vary due to age or medical condition  Airway Management Planned: Natural Airway  Additional Equipment:   Intra-op Plan:   Post-operative Plan:   Informed Consent: I have reviewed the patients History and Physical, chart, labs and discussed the procedure including the risks, benefits and alternatives for the proposed anesthesia with the patient or authorized representative who has indicated his/her understanding and acceptance.       Plan Discussed with: CRNA  Anesthesia Plan Comments: (LMA/GETA backup discussed.  Patient consented for risks of anesthesia including but not limited to:  - adverse reactions to medications - damage to eyes, teeth, lips or other oral mucosa - nerve damage due to positioning  - sore throat or  hoarseness - damage to heart, brain, nerves, lungs, other parts of body or loss of life  Informed patient about role of CRNA in peri- and intra-operative care.  Patient voiced understanding.)       Anesthesia Quick Evaluation

## 2023-06-25 NOTE — Transfer of Care (Signed)
 Immediate Anesthesia Transfer of Care Note  Patient: Frederick Lopez  Procedure(s) Performed: COLONOSCOPY WITH PROPOFOL   Patient Location: PACU  Anesthesia Type:MAC  Level of Consciousness: responds to stimulation  Airway & Oxygen Therapy: Patient Spontanous Breathing and Patient connected to face mask oxygen  Post-op Assessment: Report given to RN and Post -op Vital signs reviewed and stable  Post vital signs: stable  Last Vitals:  Vitals Value Taken Time  BP 117/104 06/25/23 1027  Temp    Pulse 75 06/25/23 1027  Resp 19 06/25/23 1027  SpO2 100 % 06/25/23 1027    Last Pain:  Vitals:   06/25/23 1027  TempSrc:   PainSc: Asleep         Complications: No notable events documented.

## 2023-06-25 NOTE — H&P (Signed)
 Luke Salaam, MD [address], [address], [place], [place], [address] [address], [address], [place], [place], [address] Phone: [phone]  Fax: [phone]  Primary Care Physician:  Rory Collard, MD   Pre-Procedure History & Physical: HPI:  Frederick Lopez is a 73 y.o. male is here for an colonoscopy.   Past Medical History:  Diagnosis Date   Diabetes mellitus without complication (HCC)    Hypertension    Thyroid  disease     Past Surgical History:  Procedure Laterality Date   REPLACEMENT TOTAL KNEE Bilateral     Prior to Admission medications   Medication Sig Start Date End Date Taking? Authorizing Provider  atenolol (TENORMIN) 25 MG tablet 1/2 a tab 10/31/22  Yes [provider]  carvedilol (COREG) 6.25 MG tablet Take 6.25 mg by mouth 2 (two) times daily. 03/24/19  Yes [provider]  lisinopril (ZESTRIL) 20 MG tablet Take 20 mg by mouth daily. 08/08/19  Yes [provider]  ferrous sulfate 325 (65 FE) MG tablet Take by mouth.    [provider]  JANUMET 50-1000 MG tablet Take 1 tablet by mouth 2 (two) times daily. 08/08/19   [provider]  omeprazole (PRILOSEC) 20 MG capsule Take by mouth.    [provider]  simvastatin (ZOCOR) 10 MG tablet Take 10 mg by mouth at bedtime. 08/08/19   [provider]  SYNTHROID 125 MCG tablet Take 125 mcg by mouth daily. 08/08/19   [provider]    Allergies as of 04/22/2023   (No Known Allergies)    Family History  Problem Relation Age of Onset   Diabetes Mother    CVA Father     Social History   Socioeconomic History   Marital status: Married    Spouse name: Not on file   Number of children: Not on file   Years of education: Not on file   Highest education level: Not on file  Occupational History   Not on file  Tobacco Use   Smoking status: Never   Smokeless tobacco: Never  Vaping Use   Vaping status: Never Used  Substance and Sexual  Activity   Alcohol use: Never   Drug use: Never   Sexual activity: Not on file  Other Topics Concern   Not on file  Social History Narrative   Not on file   Social Drivers of Health   Financial Resource Strain: Low Risk  (04/13/2023)   Received from [facility]   Overall Financial Resource Strain (CARDIA)    Difficulty of Paying Living Expenses: Not hard at all  Food Insecurity: No Food Insecurity (04/13/2023)   Received from [facility]   Hunger Vital Sign    Worried About Running Out of Food in the Last Year: Never true    Ran Out of Food in the Last Year: Never true  Transportation Needs: No Transportation Needs (04/13/2023)   Received from [facility] - Transportation    In the past 12 months, has lack of transportation kept you from medical appointments or from getting medications?: No    Lack of Transportation (Non-Medical): No  Physical Activity: Not on file  Stress: Not on file  Social Connections: Not on file  Intimate Partner Violence: Not on file    Review of Systems: See HPI, otherwise negative ROS  Physical Exam: There were no vitals taken for this visit. General:   Alert,  pleasant and cooperative in NAD Head:  Normocephalic and atraumatic. Neck:  Supple; no masses or thyromegaly. Lungs:  Clear throughout to auscultation, normal respiratory effort.    Heart:  +S1, +S2, Regular rate and rhythm, No edema. Abdomen:  Soft, nontender and nondistended. Normal bowel sounds, without guarding, and without rebound.   Neurologic:  Alert and  oriented x4;  grossly normal neurologically.  Impression/Plan: Frederick Lopez is here for an colonoscopy to be performed for positive cologuard Risks, benefits, limitations, and alternatives regarding  colonoscopy have been reviewed with the patient.  Questions have been answered.  All parties agreeable.   Luke Salaam, MD  06/25/2023, 9:24 AM\

## 2023-06-26 ENCOUNTER — Encounter: Payer: Self-pay | Admitting: Gastroenterology

## 2023-10-20 DIAGNOSIS — E119 Type 2 diabetes mellitus without complications: Secondary | ICD-10-CM | POA: Diagnosis not present

## 2023-10-20 DIAGNOSIS — E039 Hypothyroidism, unspecified: Secondary | ICD-10-CM | POA: Diagnosis not present

## 2023-10-20 DIAGNOSIS — I10 Essential (primary) hypertension: Secondary | ICD-10-CM | POA: Diagnosis not present

## 2023-10-20 DIAGNOSIS — E7800 Pure hypercholesterolemia, unspecified: Secondary | ICD-10-CM | POA: Diagnosis not present

## 2023-10-20 DIAGNOSIS — K219 Gastro-esophageal reflux disease without esophagitis: Secondary | ICD-10-CM | POA: Diagnosis not present

## 2023-10-26 DIAGNOSIS — I10 Essential (primary) hypertension: Secondary | ICD-10-CM | POA: Diagnosis not present

## 2023-10-26 DIAGNOSIS — E039 Hypothyroidism, unspecified: Secondary | ICD-10-CM | POA: Diagnosis not present

## 2023-10-26 DIAGNOSIS — E7800 Pure hypercholesterolemia, unspecified: Secondary | ICD-10-CM | POA: Diagnosis not present

## 2023-10-26 DIAGNOSIS — E119 Type 2 diabetes mellitus without complications: Secondary | ICD-10-CM | POA: Diagnosis not present

## 2023-12-09 ENCOUNTER — Encounter: Payer: Self-pay | Admitting: Family Medicine

## 2023-12-14 DIAGNOSIS — E119 Type 2 diabetes mellitus without complications: Secondary | ICD-10-CM | POA: Diagnosis not present

## 2023-12-15 DIAGNOSIS — H5203 Hypermetropia, bilateral: Secondary | ICD-10-CM | POA: Diagnosis not present

## 2023-12-17 DIAGNOSIS — Z23 Encounter for immunization: Secondary | ICD-10-CM | POA: Diagnosis not present

## 2024-02-16 NOTE — Progress Notes (Signed)
 Frederick Lopez                                          MRN: [id number]   02/16/2024   The [facility] Quality Team Specialist reviewed this patient medical record for the purposes of chart review for care gap closure. The following were reviewed: abstraction for care gap closure-glycemic status assessment.    [facility] Quality Team
